# Patient Record
Sex: Male | Born: 1965 | Race: White | Hispanic: No | Marital: Married | Smoking: Former smoker
Health system: Southern US, Community
[De-identification: ages and names within clinical notes are randomized; demographics above are authoritative.]

## PROBLEM LIST (undated history)

## (undated) DIAGNOSIS — G459 Transient cerebral ischemic attack, unspecified: Secondary | ICD-10-CM

## (undated) DIAGNOSIS — I10 Essential (primary) hypertension: Secondary | ICD-10-CM

## (undated) DIAGNOSIS — G919 Hydrocephalus, unspecified: Secondary | ICD-10-CM

## (undated) DIAGNOSIS — J45909 Unspecified asthma, uncomplicated: Secondary | ICD-10-CM

## (undated) HISTORY — PX: NECK SURGERY: SHX720

## (undated) HISTORY — PX: BACK SURGERY: SHX140

## (undated) HISTORY — PX: APPENDECTOMY: SHX54

---

## 1985-12-23 DIAGNOSIS — G459 Transient cerebral ischemic attack, unspecified: Secondary | ICD-10-CM

## 1985-12-23 HISTORY — DX: Transient cerebral ischemic attack, unspecified: G45.9

## 2003-12-24 DIAGNOSIS — G919 Hydrocephalus, unspecified: Secondary | ICD-10-CM

## 2003-12-24 HISTORY — DX: Hydrocephalus, unspecified: G91.9

## 2007-12-29 ENCOUNTER — Emergency Department (HOSPITAL_COMMUNITY): Admission: EM | Admit: 2007-12-29 | Discharge: 2007-12-29 | Payer: Self-pay | Admitting: Emergency Medicine

## 2007-12-30 ENCOUNTER — Ambulatory Visit: Payer: Self-pay | Admitting: Gastroenterology

## 2008-01-01 ENCOUNTER — Ambulatory Visit (HOSPITAL_COMMUNITY): Admission: RE | Admit: 2008-01-01 | Discharge: 2008-01-01 | Payer: Self-pay | Admitting: Gastroenterology

## 2008-01-01 ENCOUNTER — Ambulatory Visit: Payer: Self-pay | Admitting: Gastroenterology

## 2009-07-13 ENCOUNTER — Encounter (INDEPENDENT_AMBULATORY_CARE_PROVIDER_SITE_OTHER): Payer: Self-pay | Admitting: General Surgery

## 2009-07-13 ENCOUNTER — Other Ambulatory Visit: Admission: RE | Admit: 2009-07-13 | Discharge: 2009-07-13 | Payer: Self-pay | Admitting: General Surgery

## 2010-09-28 ENCOUNTER — Emergency Department (HOSPITAL_COMMUNITY): Admission: EM | Admit: 2010-09-28 | Discharge: 2010-09-28 | Payer: Self-pay | Admitting: Emergency Medicine

## 2011-05-07 NOTE — Op Note (Signed)
Jose Harmon, Jose Harmon                 ACCOUNT NO.:  [id number]   MEDICAL RECORD NO.:  [id number]          PATIENT TYPE:  AMB   LOCATION:  DAY                           FACILITY:  [facility]   PHYSICIAN:  Kassie Mends, M.D.      DATE OF BIRTH:  02-Apr-1966   DATE OF PROCEDURE:  01/01/2008  DATE OF DISCHARGE:                               OPERATIVE REPORT   REFERRING PHYSICIAN:  Bradd Canary, MD.   PROCEDURE:  Colonoscopy.   INDICATION FOR EXAM:  Mr. Sleight is a 45 year old male with history of  multiple male cancers in the family.  He presents with rectal  bleeding.   FINDINGS:  1. Rare sigmoid diverticula.  Otherwise no polyps, masses,      inflammatory changes, or AVMs seen.  2. Normal retroflexed view of the rectum.   DIAGNOSIS:  No source for [name]'s rectal bleeding identified.  The  most likely etiology is an anal fissure,  considering he had pain on  rectal exam.   RECOMMENDATIONS:  1. Begin Anusol HC 1 per rectum every 12 hours for 14 days.  2. Screening colonoscopy in 10 years.  3. He should follow high-fiber diet.  He was given a handout on high-      fiber diet and diverticulosis.   MEDICATIONS:  1. Demerol 1 mg IV.  2. Versed 6 mg IV.   PROCEDURE TECHNIQUE:  Physical exam was performed.  Informed consent was  obtained from the patient explaining benefits, risks and alternatives to  the procedure.  The patient was connected to the monitor and placed in  the left lateral position.  Continuous oxygen was provided by nasal  cannula.  IV was medicine administered through an indwelling cannula.  After administration of sedation and rectal exam, the patient's rectum  was intubated.  The  scope was advanced under direct visualization to the cecum.  The scope  was removed slowly by carefully examining the color, texture, anatomy,  and integrity mucosa on the way out.  The patient was recovered in  endoscopy and was discharged home in satisfactory condition.      Kassie Mends, M.D.  Electronically Signed     SM/MEDQ  D:  01/01/2008  T:  01/01/2008  Job:  [id number]

## 2011-05-07 NOTE — Consult Note (Signed)
Jose Harmon, Jose Harmon                 ACCOUNT NO.:  [id number]   MEDICAL RECORD NO.:  [id number]          PATIENT TYPE:  AMB   LOCATION:  DAY                           FACILITY:  [facility]   PHYSICIAN:  Kassie Mends, M.D.      DATE OF BIRTH:  March 11, 1966   DATE OF CONSULTATION:  12/30/2007  DATE OF DISCHARGE:                                 CONSULTATION   REFERRING PHYSICIAN:  Bradd Canary, MD.   REASON FOR CONSULTATION:  Rectal bleeding.   HISTORY OF PRESENT ILLNESS:  Mr. Baca is a 45 year old male who  developed rectal bleeding on Sunday.  He has a significant family  history of three male relatives with GYN cancer.  His sister had  breast cancer at 90 and died. His other sister had ovarian cancer of 65  and died.  His mother had uterine cancer.  His father had throat cancer.  He went to have a bowel movement on Sunday and saw blood in his stool  and when he wiped.  He describes it as quite a bit.  He denies any  ripping pain in his rectum, rectal itching, abdominal pain, weight loss,  problem swallowing, heartburn, indigestion, nausea, vomiting or  diarrhea.  He did complain of a painful rectal exam.  He has had no  bowel movement since his visit on Sunday.  He occasionally has  constipation.  He had no pain in his rectum when the stool passed.   PAST MEDICAL HISTORY:  1. Back problems.  2. Hypertension.  3. History of a needle stick involving an HIV positive patient.   PAST SURGICAL HISTORY:  1. Appendectomy.  2. Brain surgery for hydrocephalous.  3. Herniated disk surgery.   ALLERGIES:  No known drug allergies.   MEDICATIONS:  1. Lisinopril 20 mg daily.  2. Lovastatin 20 mg daily.  3. Darvocet.   FAMILY HISTORY:  Per the HPI.  He has no family history of inflammatory  bowel disease.   SOCIAL HISTORY:  He is married and his wife is from [place]. He is  currently unemployed.  He denies any tobacco or alcohol use.   REVIEW OF SYSTEMS:  Per the HPI otherwise all  systems negative.   PHYSICAL EXAM:  Weight 187 pounds, height 5 feet 6 inches, BMI 30.2  (obese), temperature 98, blood pressure 118/88, pulse 64.  GENERAL:  He is in no apparent distress, alert and oriented x4.  HEENT:  Atraumatic, normocephalic.  Pupils equal and reactive to light.  Mouth no oral lesions.  Posterior pharynx without erythema or exudate.  NECK:  Full range of motion, no lymphadenopathy.  LUNGS:  Clear to auscultation bilaterally.  CARDIOVASCULAR:  Regular rhythm, no murmur, normal S1 and S2.  ABDOMEN:  Bowel sounds present, soft, nontender, nondistended.  No  rebound or guarding.  He has no abdominal bruits or pulsatile masses.  EXTREMITIES:  Without cyanosis, clubbing or edema.  NEURO:  He has no focal neurologic deficits.   LABS FROM JANUARY 6:  White count 8.6, hemoglobin 15, platelets 244.   ASSESSMENT:  Mr. Jacob is a  45 year old male who has a significant  family history of male cancers.  He presents with rectal bleeding.  The differential diagnosis includes anal fissure, hemorrhoids, and a low  likelihood of colon cancer due to hereditary non pulposus colon cancer.  Thank you for allowing me to see Mr. Schiefelbein in consultation.  My  recommendations follow.   RECOMMENDATIONS:  1. Mr. Dunson is new to town and his wife is visually impaired.  He      while have a colonoscopy on Friday.  2. Colonoscopy will be performed a HalfLytely bowel prep.  3. He has a follow-up appointment to see me as needed.  4. Scheduled with [facility] to manage his      hypertension.      Kassie Mends, M.D.  Electronically Signed     SM/MEDQ  D:  12/30/2007  T:  12/30/2007  Job:  [id number]

## 2011-09-12 LAB — DIFFERENTIAL
Basophils Absolute: 0
Basophils Relative: 0
Eosinophils Absolute: 0.1
Eosinophils Relative: 2
Lymphocytes Relative: 20
Lymphs Abs: 1.7
Monocytes Absolute: 0.5
Monocytes Relative: 6
Neutro Abs: 6.2
Neutrophils Relative %: 72

## 2011-09-12 LAB — CBC
HCT: 44.4
Hemoglobin: 15.1
MCHC: 33.9
MCV: 89.9
Platelets: 244
RBC: 4.94
RDW: 13.1
WBC: 8.6

## 2012-02-19 ENCOUNTER — Ambulatory Visit: Payer: Self-pay | Admitting: Unknown Physician Specialty

## 2012-03-28 ENCOUNTER — Emergency Department (HOSPITAL_COMMUNITY): Payer: Self-pay

## 2012-03-28 ENCOUNTER — Other Ambulatory Visit: Payer: Self-pay

## 2012-03-28 ENCOUNTER — Emergency Department (HOSPITAL_COMMUNITY)
Admission: EM | Admit: 2012-03-28 | Discharge: 2012-03-28 | Disposition: A | Discharge status: Home / Self Care | Payer: Self-pay | Attending: Emergency Medicine | Admitting: Emergency Medicine

## 2012-03-28 ENCOUNTER — Encounter (HOSPITAL_COMMUNITY): Payer: Self-pay | Admitting: Emergency Medicine

## 2012-03-28 DIAGNOSIS — M7710 Lateral epicondylitis, unspecified elbow: Secondary | ICD-10-CM | POA: Insufficient documentation

## 2012-03-28 DIAGNOSIS — Z136 Encounter for screening for cardiovascular disorders: Secondary | ICD-10-CM | POA: Insufficient documentation

## 2012-03-28 DIAGNOSIS — IMO0002 Reserved for concepts with insufficient information to code with codable children: Secondary | ICD-10-CM

## 2012-03-28 MED ORDER — IBUPROFEN 800 MG PO TABS: 800.0000 mg | ORAL_TABLET | Freq: Three times a day (TID) | ORAL | Status: AC

## 2012-03-28 MED ORDER — PREDNISONE 50 MG PO TABS: ORAL_TABLET | ORAL | Status: AC

## 2012-03-28 NOTE — ED Provider Notes (Signed)
History    Scribed for Glynn Octave, MD, the patient was seen in room [facility]. This chart was scribed by Katha Cabal.   CSN: [id number]  Arrival date & time 03/28/12  1903   First MD Initiated Contact with Patient 03/28/12 1911      Chief Complaint  Patient presents with  . Extremity Pain    (Consider location/radiation/quality/duration/timing/severity/associated sxs/prior Treatment)  Pt was seen at 7:18 PM   Patient is a 46 y.o. male presenting with extremity pain.  Extremity Pain This is a recurrent problem. Episode onset: 6 months ago. The problem occurs constantly. The problem has been gradually worsening (over past month). Pertinent negatives include no chest pain and no abdominal pain. The symptoms are aggravated by nothing. The symptoms are relieved by nothing. He has tried a cold compress for the symptoms.   Patient complains of left lateral epicondyle with numbness and tingling that radiates to left hand.  Denies weakness.  Patient is right handed.  There is no known injury.  Denies chest, abdominal and back pain.  No chronic medication problems.       History reviewed. No pertinent past medical history.  Past Surgical History  Procedure Date  . Appendectomy   . Back surgery   . Neck surgery     No family history on file.  History  Substance Use Topics  . Smoking status: Never Smoker   . Smokeless tobacco: Not on file  . Alcohol Use: No      Review of Systems  Cardiovascular: Negative for chest pain.  Gastrointestinal: Negative for abdominal pain.  Musculoskeletal: Negative for back pain.  Neurological: Positive for numbness. Negative for weakness.  All other systems reviewed and are negative.    Allergies  Review of patient's allergies indicates no known allergies.  Home Medications   Current Outpatient Rx  Name Route Sig Dispense Refill  . ASPIRIN EC 81 MG PO TBEC Oral Take 81 mg by mouth daily.    Marland Kitchen LISINOPRIL 20 MG PO TABS Oral Take  20 mg by mouth daily.    . IBUPROFEN 800 MG PO TABS Oral Take 1 tablet (800 mg total) by mouth 3 (three) times daily. 21 tablet 0  . PREDNISONE 50 MG PO TABS  1 tablet PO daily 5 tablet 0    BP 141/74  Pulse 85  Temp(Src) 97.5 F (36.4 C) (Oral)  Resp 20  Ht 5\' 6"  (1.676 m)  Wt 160 lb (72.576 kg)  BMI 25.82 kg/m2  SpO2 100%  Physical Exam  Nursing note and vitals reviewed. Constitutional: He is oriented to person, place, and time. He appears well-developed. No distress.  HENT:  Head: Normocephalic and atraumatic.  Eyes: Conjunctivae and EOM are normal.  Neck: Neck supple.  Cardiovascular: Normal rate and regular rhythm.   Pulmonary/Chest: Effort normal. No respiratory distress.  Abdominal: Soft. There is no tenderness. There is no rebound and no guarding.  Musculoskeletal: Normal range of motion. He exhibits no edema.       Cardial hand movements in tact, equal grip strength, FROM of elbow, wrist and finger, no evidence of septic joint   Neurological: He is alert and oriented to person, place, and time. No sensory deficit. Coordination normal.  Skin: Skin is warm and dry.  Psychiatric: He has a normal mood and affect. His behavior is normal.    ED Course  Procedures (including critical care time)    DIAGNOSTIC STUDIES: Oxygen Saturation is 100% on room air, normal by my  interpretation.     COORDINATION OF CARE: 7:23 PM  Physical exam complete.  8:12 PM  Plan to discharge patient.  Patient agrees with plan.     LABS / RADIOLOGY:   Labs Reviewed - No data to display Dg Elbow Complete Left  03/28/2012  *RADIOLOGY REPORT*  Clinical Data: Elbow pain.  No known injury  LEFT ELBOW - COMPLETE 3+ VIEW  Comparison:  None.  Findings:  There is no evidence of fracture, dislocation, or joint effusion.  There is no evidence of arthropathy or other focal bone abnormality.  Soft tissues are unremarkable.  IMPRESSION: Negative.  Original Report Authenticated By: Camelia Phenes, M.D.          MDM  Left lateral epicondyle pain and radiating to the forearm for the past several months. No known injury. No weakness. Cardinal hand movements intact, +2 radial pulse.  We'll treat as epicondylitis as followup with orthopedics.   Date: 03/28/2012  Rate: 80  Rhythm: normal sinus rhythm  QRS Axis: normal  Intervals: normal  ST/T Wave abnormalities: normal  Conduction Disutrbances:none  Narrative Interpretation:   Old EKG Reviewed: none available        MEDICATIONS GIVEN IN THE E.D. Scheduled Meds:   Continuous Infusions:       IMPRESSION: 1. Epicondylitis      NEW MEDICATIONS: New Prescriptions   IBUPROFEN (ADVIL,MOTRIN) 800 MG TABLET    Take 1 tablet (800 mg total) by mouth 3 (three) times daily.   PREDNISONE (DELTASONE) 50 MG TABLET    1 tablet PO daily      I personally performed the services described in this documentation, which was scribed in my presence.  The recorded information has been reviewed and considered.        Glynn Octave, MD 03/28/12 2306

## 2012-03-28 NOTE — ED Notes (Signed)
Pt with left elbow pain that radiates into the forearm x 6 months.  Denies injury

## 2012-03-28 NOTE — Discharge Instructions (Signed)
Lateral Epicondylitis (Tennis Elbow) with Rehab Lateral epicondylitis involves inflammation and pain around the outer portion of the elbow. The pain is caused by inflammation of the tendons in the forearm that bring back (extend) the wrist. Lateral epicondylittis is also called tennis elbow, because it is very common in tennis players. However, it may occur in any individual who extends the wrist repetitively. If lateral epicondylitis is left untreated, it may become a chronic problem. SYMPTOMS   Pain, tenderness, and inflammation on the outer (lateral) side of the elbow.   Pain or weakness with gripping activities.   Pain that increases with wrist twisting motions (playing tennis, using a screwdriver, opening a door or a jar).   Pain with lifting objects, including a coffee cup.  CAUSES  Lateral epicondylitis is caused by inflammation of the tendons that extend the wrist. Causes of injury may include:  Repetitive stress and strain on the muscles and tendons that extend the wrist.   Sudden change in activity level or intensity.   Incorrect grip in racquet sports.   Incorrect grip size of racquet (often too large).   Incorrect hitting position or technique (usually backhand, leading with the elbow).   Using a racket that is too heavy.  RISK INCREASES WITH:  Sports or occupations that require repetitive and/or strenuous forearm and wrist movements (tennis, squash, racquetball, carpentry).   Poor wrist and forearm strength and flexibility.   Failure to warm up properly before activity.   Resuming activity before healing, rehabilitation, and conditioning are complete.  PREVENTION   Warm up and stretch properly before activity.   Maintain physical fitness:   Strength, flexibility, and endurance.   Cardiovascular fitness.   Wear and use properly fitted equipment.   Learn and use proper technique and have a coach correct improper technique.   Wear a tennis elbow  (counterforce) brace.  PROGNOSIS  The course of this condition depends on the degree of the injury. If treated properly, acute cases (symptoms lasting less than 4 weeks) are often resolved in 2 to 6 weeks. Chronic (longer lasting cases) often resolve in 3 to 6 months, but may require physical therapy. RELATED COMPLICATIONS   Frequently recurring symptoms, resulting in a chronic problem. Properly treating the problem the first time decreases frequency of recurrence.   Chronic inflammation, scarring tendon degeneration, and partial tendon tear, requiring surgery.   Delayed healing or resolution of symptoms.  TREATMENT  Treatment first involves the use of ice and medicine, to reduce pain and inflammation. Strengthening and stretching exercises may help reduce discomfort, if performed regularly. These exercises may be performed at home, if the condition is an acute injury. Chronic cases may require a referral to a physical therapist for evaluation and treatment. Your caregiver may advise a corticosteroid injection, to help reduce inflammation. Rarely, surgery is needed. MEDICATION  If pain medicine is needed, nonsteroidal anti-inflammatory medicines (aspirin and ibuprofen), or other minor pain relievers (acetaminophen), are often advised.   Do not take pain medicine for 7 days before surgery.   Prescription pain relievers may be given, if your caregiver thinks they are needed. Use only as directed and only as much as you need.   Corticosteroid injections may be recommended. These injections should be reserved only for the most severe cases, because they can only be given a certain number of times.  HEAT AND COLD  Cold treatment (icing) should be applied for 10 to 15 minutes every 2 to 3 hours for inflammation and pain, and immediately  after activity that aggravates your symptoms. Use ice packs or an ice massage.   Heat treatment may be used before performing stretching and strengthening  activities prescribed by your caregiver, physical therapist, or athletic trainer. Use a heat pack or a warm water soak.  SEEK MEDICAL CARE IF: Symptoms get worse or do not improve in 2 weeks, despite treatment. EXERCISES  RANGE OF MOTION (ROM) AND STRETCHING EXERCISES - Epicondylitis, Lateral (Tennis Elbow) These exercises may help you when beginning to rehabilitate your injury. Your symptoms may go away with or without further involvement from your physician, physical therapist or athletic trainer. While completing these exercises, remember:   Restoring tissue flexibility helps normal motion to return to the joints. This allows healthier, less painful movement and activity.   An effective stretch should be held for at least 30 seconds.   A stretch should never be painful. You should only feel a gentle lengthening or release in the stretched tissue.  RANGE OF MOTION - Wrist Flexion, Active-Assisted  Extend your right / left elbow with your fingers pointing down.*   Gently pull the back of your hand towards you, until you feel a gentle stretch on the top of your forearm.   Hold this position for __________ seconds.  Repeat __________ times. Complete this exercise __________ times per day.  *If directed by your physician, physical therapist or athletic trainer, complete this stretch with your elbow bent, rather than extended. RANGE OF MOTION - Wrist Extension, Active-Assisted  Extend your right / left elbow and turn your palm upwards.*   Gently pull your palm and fingertips back, so your wrist extends and your fingers point more toward the ground.   You should feel a gentle stretch on the inside of your forearm.   Hold this position for __________ seconds.  Repeat __________ times. Complete this exercise __________ times per day. *If directed by your physician, physical therapist or athletic trainer, complete this stretch with your elbow bent, rather than extended. STRETCH - Wrist  Flexion  Place the back of your right / left hand on a tabletop, leaving your elbow slightly bent. Your fingers should point away from your body.   Gently press the back of your hand down onto the table by straightening your elbow. You should feel a stretch on the top of your forearm.   Hold this position for __________ seconds.  Repeat __________ times. Complete this stretch __________ times per day.  STRETCH - Wrist Extension   Place your right / left fingertips on a tabletop, leaving your elbow slightly bent. Your fingers should point backwards.   Gently press your fingers and palm down onto the table by straightening your elbow. You should feel a stretch on the inside of your forearm.   Hold this position for __________ seconds.  Repeat __________ times. Complete this stretch __________ times per day.  STRENGTHENING EXERCISES - Epicondylitis, Lateral (Tennis Elbow) These exercises may help you when beginning to rehabilitate your injury. They may resolve your symptoms with or without further involvement from your physician, physical therapist or athletic trainer. While completing these exercises, remember:   Muscles can gain both the endurance and the strength needed for everyday activities through controlled exercises.   Complete these exercises as instructed by your physician, physical therapist or athletic trainer. Increase the resistance and repetitions only as guided.   You may experience muscle soreness or fatigue, but the pain or discomfort you are trying to eliminate should never worsen during these exercises. If  this pain does get worse, stop and make sure you are following the directions exactly. If the pain is still present after adjustments, discontinue the exercise until you can discuss the trouble with your caregiver.  STRENGTH - Wrist Flexors  Sit with your right / left forearm palm-up and fully supported on a table or countertop. Your elbow should be resting below the  height of your shoulder. Allow your wrist to extend over the edge of the surface.   Loosely holding a __________ weight, or a piece of rubber exercise band or tubing, slowly curl your hand up toward your forearm.   Hold this position for __________ seconds. Slowly lower the wrist back to the starting position in a controlled manner.  Repeat __________ times. Complete this exercise __________ times per day.  STRENGTH - Wrist Extensors  Sit with your right / left forearm palm-down and fully supported on a table or countertop. Your elbow should be resting below the height of your shoulder. Allow your wrist to extend over the edge of the surface.   Loosely holding a __________ weight, or a piece of rubber exercise band or tubing, slowly curl your hand up toward your forearm.   Hold this position for __________ seconds. Slowly lower the wrist back to the starting position in a controlled manner.  Repeat __________ times. Complete this exercise __________ times per day.  STRENGTH - Ulnar Deviators  Stand with a ____________________ weight in your right / left hand, or sit while holding a rubber exercise band or tubing, with your healthy arm supported on a table or countertop.   Move your wrist, so that your pinkie travels toward your forearm and your thumb moves away from your forearm.   Hold this position for __________ seconds and then slowly lower the wrist back to the starting position.  Repeat __________ times. Complete this exercise __________ times per day STRENGTH - Radial Deviators  Stand with a ____________________ weight in your right / left hand, or sit while holding a rubber exercise band or tubing, with your injured arm supported on a table or countertop.   Raise your hand upward in front of you or pull up on the rubber tubing.   Hold this position for __________ seconds and then slowly lower the wrist back to the starting position.  Repeat __________ times. Complete this  exercise __________ times per day. STRENGTH - Forearm Supinators   Sit with your right / left forearm supported on a table, keeping your elbow below shoulder height. Rest your hand over the edge, palm down.   Gently grip a hammer or a soup ladle.   Without moving your elbow, slowly turn your palm and hand upward to a "thumbs-up" position.   Hold this position for __________ seconds. Slowly return to the starting position.  Repeat __________ times. Complete this exercise __________ times per day.  STRENGTH - Forearm Pronators   Sit with your right / left forearm supported on a table, keeping your elbow below shoulder height. Rest your hand over the edge, palm up.   Gently grip a hammer or a soup ladle.   Without moving your elbow, slowly turn your palm and hand upward to a "thumbs-up" position.   Hold this position for __________ seconds. Slowly return to the starting position.  Repeat __________ times. Complete this exercise __________ times per day.  STRENGTH - Grip  Grasp a tennis ball, a dense sponge, or a large, rolled sock in your hand.   Squeeze as hard  as you can, without increasing any pain.   Hold this position for __________ seconds. Release your grip slowly.  Repeat __________ times. Complete this exercise __________ times per day.  STRENGTH - Elbow Extensors, Isometric  Stand or sit upright, on a firm surface. Place your right / left arm so that your palm faces your stomach, and it is at the height of your waist.   Place your opposite hand on the underside of your forearm. Gently push up as your right / left arm resists. Push as hard as you can with both arms, without causing any pain or movement at your right / left elbow. Hold this stationary position for __________ seconds.  Gradually release the tension in both arms. Allow your muscles to relax completely before repeating. Document Released: 12/09/2005 Document Revised: 11/28/2011 Document Reviewed:  03/23/2009 [facility] Patient Information 2012 [place], [place].

## 2013-01-04 ENCOUNTER — Emergency Department (HOSPITAL_COMMUNITY): Payer: 59

## 2013-01-04 ENCOUNTER — Emergency Department (HOSPITAL_COMMUNITY)
Admission: EM | Admit: 2013-01-04 | Discharge: 2013-01-04 | Disposition: A | Discharge status: Home / Self Care | Payer: 59 | Attending: Emergency Medicine | Admitting: Emergency Medicine

## 2013-01-04 ENCOUNTER — Encounter (HOSPITAL_COMMUNITY): Payer: Self-pay | Admitting: *Deleted

## 2013-01-04 DIAGNOSIS — Z7982 Long term (current) use of aspirin: Secondary | ICD-10-CM | POA: Insufficient documentation

## 2013-01-04 DIAGNOSIS — Z79899 Other long term (current) drug therapy: Secondary | ICD-10-CM | POA: Insufficient documentation

## 2013-01-04 DIAGNOSIS — B349 Viral infection, unspecified: Secondary | ICD-10-CM

## 2013-01-04 DIAGNOSIS — R5381 Other malaise: Secondary | ICD-10-CM | POA: Insufficient documentation

## 2013-01-04 DIAGNOSIS — B9789 Other viral agents as the cause of diseases classified elsewhere: Secondary | ICD-10-CM | POA: Insufficient documentation

## 2013-01-04 DIAGNOSIS — I10 Essential (primary) hypertension: Secondary | ICD-10-CM | POA: Insufficient documentation

## 2013-01-04 DIAGNOSIS — R630 Anorexia: Secondary | ICD-10-CM | POA: Insufficient documentation

## 2013-01-04 DIAGNOSIS — IMO0001 Reserved for inherently not codable concepts without codable children: Secondary | ICD-10-CM | POA: Insufficient documentation

## 2013-01-04 DIAGNOSIS — R61 Generalized hyperhidrosis: Secondary | ICD-10-CM | POA: Insufficient documentation

## 2013-01-04 DIAGNOSIS — J3489 Other specified disorders of nose and nasal sinuses: Secondary | ICD-10-CM | POA: Insufficient documentation

## 2013-01-04 HISTORY — DX: Essential (primary) hypertension: I10

## 2013-01-04 MED ORDER — ALBUTEROL SULFATE HFA 108 (90 BASE) MCG/ACT IN AERS
2.0000 | INHALATION_SPRAY | Freq: Once | RESPIRATORY_TRACT | Status: AC
  Administered 2013-01-04: 2 via RESPIRATORY_TRACT
  Filled 2013-01-04: qty 6.7

## 2013-01-04 MED ORDER — HYDROCOD POLST-CHLORPHEN POLST 10-8 MG/5ML PO LQCR: 5.0000 mL | Freq: Two times a day (BID) | ORAL | Status: AC

## 2013-01-04 MED ORDER — OSELTAMIVIR PHOSPHATE 75 MG PO CAPS: 75.0000 mg | ORAL_CAPSULE | Freq: Two times a day (BID) | ORAL | Status: AC

## 2013-01-04 NOTE — ED Notes (Signed)
J. Idol, PA at bedside. 

## 2013-01-04 NOTE — ED Notes (Signed)
Pt with flu like symptoms, c/o pain with coughing, denies N/V/D

## 2013-01-04 NOTE — ED Notes (Signed)
Patient transported to X-ray 

## 2013-01-04 NOTE — ED Provider Notes (Signed)
History     CSN: [id number]  Arrival date & time 01/04/13  1049   First MD Initiated Contact with Patient 01/04/13 1127      Chief Complaint  Patient presents with  . Influenza    (Consider location/radiation/quality/duration/timing/severity/associated sxs/prior treatment) Patient is a 47 y.o. male presenting with flu symptoms. The history is provided by the patient.  Influenza This is a new problem. Episode onset: 2 days ago. The problem has been gradually worsening. Associated symptoms include anorexia, congestion, coughing, diaphoresis, fatigue, a fever, headaches and myalgias. Pertinent negatives include no abdominal pain, arthralgias, change in bowel habit, chest pain, joint swelling, nausea, neck pain, numbness, sore throat, vomiting or weakness. Associated symptoms comments: Cough has been nonproductive.  Fever has been to 102. He has been tolerating PO fluids. . Nothing aggravates the symptoms. He has tried acetaminophen for the symptoms. The treatment provided no relief.    Past Medical History  Diagnosis Date  . Hypertension     Past Surgical History  Procedure Date  . Appendectomy   . Back surgery   . Neck surgery     No family history on file.  History  Substance Use Topics  . Smoking status: Never Smoker   . Smokeless tobacco: Not on file  . Alcohol Use: No      Review of Systems  Constitutional: Positive for fever, diaphoresis and fatigue.  HENT: Positive for congestion. Negative for sore throat and neck pain.   Eyes: Negative.   Respiratory: Positive for cough. Negative for chest tightness and shortness of breath.   Cardiovascular: Negative for chest pain.  Gastrointestinal: Positive for anorexia. Negative for nausea, vomiting, abdominal pain and change in bowel habit.  Genitourinary: Negative.  Negative for difficulty urinating.  Musculoskeletal: Positive for myalgias. Negative for joint swelling and arthralgias.  Skin: Negative.   Neurological:  Positive for headaches. Negative for dizziness, weakness, light-headedness and numbness.  Hematological: Negative.   Psychiatric/Behavioral: Negative.     Allergies  Review of patient's allergies indicates no known allergies.  Home Medications   Current Outpatient Rx  Name  Route  Sig  Dispense  Refill  . ACETAMINOPHEN 500 MG PO TABS   Oral   Take 1,000 mg by mouth every 6 (six) hours as needed. Pain/fever.         . ASPIRIN EC 81 MG PO TBEC   Oral   Take 81 mg by mouth daily.         Marland Kitchen LISINOPRIL 20 MG PO TABS   Oral   Take 20 mg by mouth daily.         . MELOXICAM 15 MG PO TABS   Oral   Take 15 mg by mouth daily.         Marland Kitchen HYDROCOD POLST-CPM POLST ER 10-8 MG/5ML PO LQCR   Oral   Take 5 mLs by mouth every 12 (twelve) hours.   100 mL   0   . OSELTAMIVIR PHOSPHATE 75 MG PO CAPS   Oral   Take 1 capsule (75 mg total) by mouth every 12 (twelve) hours.   10 capsule   0     BP 127/86  Pulse 86  Temp 98.1 F (36.7 C) (Oral)  Resp 16  Ht 5\' 6"  (1.676 m)  Wt 168 lb (76.204 kg)  BMI 27.12 kg/m2  SpO2 96%  Physical Exam  Nursing note and vitals reviewed. Constitutional: He appears well-developed and well-nourished.  HENT:  Head: Normocephalic and atraumatic.  Right  Ear: Tympanic membrane normal.  Left Ear: Tympanic membrane normal.  Nose: No mucosal edema or rhinorrhea.  Mouth/Throat: Uvula is midline and mucous membranes are normal. No oropharyngeal exudate, posterior oropharyngeal edema, posterior oropharyngeal erythema or tonsillar abscesses.  Eyes: Conjunctivae normal are normal.  Neck: Normal range of motion. Neck supple.  Cardiovascular: Normal rate, regular rhythm, normal heart sounds and intact distal pulses.   Pulmonary/Chest: Effort normal. No respiratory distress. He has wheezes. He has no rhonchi. He has no rales.       Expiratory wheeze right base.  Abdominal: Soft. Bowel sounds are normal. There is no tenderness.  Musculoskeletal: Normal  range of motion.  Neurological: He is alert.  Skin: Skin is warm and dry.  Psychiatric: He has a normal mood and affect.    ED Course  Procedures (including critical care time)  Labs Reviewed - No data to display Dg Chest 2 View  01/04/2013  *RADIOLOGY REPORT*  Clinical Data: Body aches, fever, cough, congestion, hypertension  CHEST - 2 VIEW  Comparison: None.  Findings: Upper normal heart size. Mediastinal contours and pulmonary vascularity. Lungs clear. No pleural effusion or pneumothorax. No acute osseous findings.  IMPRESSION: No acute abnormalities.   Original Report Authenticated By: Ulyses Southward, M.D.      1. Viral syndrome       MDM  Probable influenza.  Pt's wife also here to be seen for similar sx (hers started 2 days before his).  Patients labs and/or radiological studies were reviewed during the medical decision making and disposition process. Pt was prescribed tamiflu,  Also prescribed tussionex for cough,  Albuterol mdi with spacer given, instructed in use by respiratory therapist.  Encouraged rest, increased fluids.  Tylenol or motrin for fever reduction.  Recheck here for any worsened sx.  The patient appears reasonably screened and/or stabilized for discharge and I doubt any other medical condition or other [facility] requiring further screening, evaluation, or treatment in the ED at this time prior to discharge.         Burgess Amor, [place] 01/04/13 1555

## 2013-01-04 NOTE — ED Notes (Signed)
Body aches, headache, fever, non productive cough. Denies N/V/D. Wife is being treated for same.

## 2013-01-04 NOTE — ED Provider Notes (Signed)
Medical screening examination/treatment/procedure(s) were performed by non-physician practitioner and as supervising physician I was immediately available for consultation/collaboration.   Jorge Retz W Chuckie Mccathern, MD 01/04/13 1627 

## 2013-02-06 ENCOUNTER — Other Ambulatory Visit: Payer: Self-pay

## 2013-04-11 ENCOUNTER — Emergency Department: Payer: Self-pay | Admitting: Emergency Medicine

## 2013-04-11 LAB — PRO B NATRIURETIC PEPTIDE: B-Type Natriuretic Peptide: 33 pg/mL (ref 0–125)

## 2013-04-11 LAB — COMPREHENSIVE METABOLIC PANEL
Albumin: 3.9 g/dL (ref 3.4–5.0)
Calcium, Total: 8.4 mg/dL — ABNORMAL LOW (ref 8.5–10.1)
Chloride: 109 mmol/L — ABNORMAL HIGH (ref 98–107)
EGFR (African American): >60
EGFR (Non-African Amer.): >60
Glucose: 84 mg/dL (ref 65–99)
Potassium: 3.8 mmol/L (ref 3.5–5.1)
SGOT(AST): 16 U/L (ref 15–37)
Sodium: 142 mmol/L (ref 136–145)
Total Protein: 6.7 g/dL (ref 6.4–8.2)

## 2013-04-11 LAB — CBC
HCT: 40.3 % (ref 40.0–52.0)
HGB: 13.8 g/dL (ref 13.0–18.0)
Platelet: 205 10*3/uL (ref 150–440)
RBC: 4.45 10*6/uL (ref 4.40–5.90)
RDW: 13.1 % (ref 11.5–14.5)
WBC: 8.5 10*3/uL (ref 3.8–10.6)

## 2013-04-11 LAB — CK TOTAL AND CKMB (NOT AT ARMC): CK-MB: 0.8 ng/mL (ref 0.5–3.6)

## 2013-04-11 LAB — TROPONIN I: Troponin-I: <0.02 ng/mL

## 2013-08-12 ENCOUNTER — Ambulatory Visit: Payer: Self-pay | Admitting: Specialist

## 2013-10-28 ENCOUNTER — Other Ambulatory Visit: Payer: Self-pay

## 2014-01-08 ENCOUNTER — Emergency Department: Payer: Self-pay | Admitting: Emergency Medicine

## 2016-08-19 ENCOUNTER — Encounter (HOSPITAL_COMMUNITY): Payer: Self-pay

## 2016-08-19 ENCOUNTER — Emergency Department (HOSPITAL_COMMUNITY)
Admission: EM | Admit: 2016-08-19 | Discharge: 2016-08-19 | Disposition: A | Discharge status: Home / Self Care | Payer: BLUE CROSS/BLUE SHIELD | Attending: Emergency Medicine | Admitting: Emergency Medicine

## 2016-08-19 ENCOUNTER — Emergency Department (HOSPITAL_COMMUNITY): Payer: BLUE CROSS/BLUE SHIELD

## 2016-08-19 DIAGNOSIS — Z5181 Encounter for therapeutic drug level monitoring: Secondary | ICD-10-CM | POA: Diagnosis not present

## 2016-08-19 DIAGNOSIS — R531 Weakness: Secondary | ICD-10-CM | POA: Insufficient documentation

## 2016-08-19 DIAGNOSIS — Z8673 Personal history of transient ischemic attack (TIA), and cerebral infarction without residual deficits: Secondary | ICD-10-CM | POA: Diagnosis not present

## 2016-08-19 DIAGNOSIS — Z7982 Long term (current) use of aspirin: Secondary | ICD-10-CM | POA: Diagnosis not present

## 2016-08-19 DIAGNOSIS — I10 Essential (primary) hypertension: Secondary | ICD-10-CM | POA: Diagnosis not present

## 2016-08-19 DIAGNOSIS — Z87891 Personal history of nicotine dependence: Secondary | ICD-10-CM | POA: Insufficient documentation

## 2016-08-19 DIAGNOSIS — R519 Headache, unspecified: Secondary | ICD-10-CM

## 2016-08-19 DIAGNOSIS — Z79899 Other long term (current) drug therapy: Secondary | ICD-10-CM | POA: Diagnosis not present

## 2016-08-19 DIAGNOSIS — J45909 Unspecified asthma, uncomplicated: Secondary | ICD-10-CM | POA: Insufficient documentation

## 2016-08-19 DIAGNOSIS — R51 Headache: Secondary | ICD-10-CM | POA: Insufficient documentation

## 2016-08-19 HISTORY — DX: Transient cerebral ischemic attack, unspecified: G45.9

## 2016-08-19 HISTORY — DX: Hydrocephalus, unspecified: G91.9

## 2016-08-19 HISTORY — DX: Unspecified asthma, uncomplicated: J45.909

## 2016-08-19 LAB — URINALYSIS, ROUTINE W REFLEX MICROSCOPIC
Bilirubin Urine: NEGATIVE
GLUCOSE, UA: NEGATIVE mg/dL
HGB URINE DIPSTICK: NEGATIVE
KETONES UR: NEGATIVE mg/dL
LEUKOCYTES UA: NEGATIVE
Nitrite: NEGATIVE
PROTEIN: NEGATIVE mg/dL
Specific Gravity, Urine: 1.017 (ref 1.005–1.030)
pH: 7 (ref 5.0–8.0)

## 2016-08-19 LAB — I-STAT CHEM 8, ED
BUN: 16 mg/dL (ref 6–20)
CALCIUM ION: 1.18 mmol/L (ref 1.13–1.30)
CREATININE: 1.1 mg/dL (ref 0.61–1.24)
Chloride: 105 mmol/L (ref 101–111)
GLUCOSE: 90 mg/dL (ref 65–99)
HCT: 44 % (ref 39.0–52.0)
HEMOGLOBIN: 15 g/dL (ref 13.0–17.0)
POTASSIUM: 3.5 mmol/L (ref 3.5–5.1)
Sodium: 145 mmol/L (ref 135–145)
TCO2: 25 mmol/L (ref 0–100)

## 2016-08-19 LAB — DIFFERENTIAL
BASOS ABS: 0 10*3/uL (ref 0.0–0.1)
Basophils Relative: 0 %
Eosinophils Absolute: 0.1 10*3/uL (ref 0.0–0.7)
Eosinophils Relative: 1 %
LYMPHS PCT: 21 %
Lymphs Abs: 1.7 10*3/uL (ref 0.7–4.0)
MONOS PCT: 6 %
Monocytes Absolute: 0.5 10*3/uL (ref 0.1–1.0)
NEUTROS ABS: 5.9 10*3/uL (ref 1.7–7.7)
Neutrophils Relative %: 72 %

## 2016-08-19 LAB — COMPREHENSIVE METABOLIC PANEL
ALBUMIN: 4.2 g/dL (ref 3.5–5.0)
ALT: 22 U/L (ref 17–63)
AST: 21 U/L (ref 15–41)
Alkaline Phosphatase: 44 U/L (ref 38–126)
Anion gap: 9 (ref 5–15)
BILIRUBIN TOTAL: 0.3 mg/dL (ref 0.3–1.2)
BUN: 13 mg/dL (ref 6–20)
CO2: 25 mmol/L (ref 22–32)
CREATININE: 1.14 mg/dL (ref 0.61–1.24)
Calcium: 9.2 mg/dL (ref 8.9–10.3)
Chloride: 108 mmol/L (ref 101–111)
GFR calc Af Amer: >60 mL/min (ref >60)
GLUCOSE: 93 mg/dL (ref 65–99)
POTASSIUM: 3.5 mmol/L (ref 3.5–5.1)
Sodium: 142 mmol/L (ref 135–145)
TOTAL PROTEIN: 6.6 g/dL (ref 6.5–8.1)

## 2016-08-19 LAB — CBC
HEMATOCRIT: 45.2 % (ref 39.0–52.0)
HEMOGLOBIN: 14.8 g/dL (ref 13.0–17.0)
MCH: 30.8 pg (ref 26.0–34.0)
MCHC: 32.7 g/dL (ref 30.0–36.0)
MCV: 94 fL (ref 78.0–100.0)
Platelets: 239 10*3/uL (ref 150–400)
RBC: 4.81 MIL/uL (ref 4.22–5.81)
RDW: 12.7 % (ref 11.5–15.5)
WBC: 8.1 10*3/uL (ref 4.0–10.5)

## 2016-08-19 LAB — RAPID URINE DRUG SCREEN, HOSP PERFORMED
Amphetamines: NOT DETECTED
BARBITURATES: NOT DETECTED
BENZODIAZEPINES: NOT DETECTED
COCAINE: NOT DETECTED
Opiates: NOT DETECTED
TETRAHYDROCANNABINOL: NOT DETECTED

## 2016-08-19 LAB — CBG MONITORING, ED: GLUCOSE-CAPILLARY: 94 mg/dL (ref 65–99)

## 2016-08-19 LAB — I-STAT TROPONIN, ED: TROPONIN I, POC: 0 ng/mL (ref 0.00–0.08)

## 2016-08-19 LAB — APTT: APTT: 26 s (ref 24–36)

## 2016-08-19 LAB — PROTIME-INR
INR: 0.94
Prothrombin Time: 12.6 seconds (ref 11.4–15.2)

## 2016-08-19 LAB — ETHANOL

## 2016-08-19 MED ORDER — METHYLPREDNISOLONE SODIUM SUCC 125 MG IJ SOLR
125.0000 mg | Freq: Once | INTRAMUSCULAR | Status: AC
  Administered 2016-08-19: 125 mg via INTRAVENOUS
  Filled 2016-08-19: qty 2

## 2016-08-19 MED ORDER — SODIUM CHLORIDE 0.9 % IV BOLUS (SEPSIS)
1000.0000 mL | Freq: Once | INTRAVENOUS | Status: AC
  Administered 2016-08-19: 1000 mL via INTRAVENOUS

## 2016-08-19 MED ORDER — METOCLOPRAMIDE HCL 5 MG/ML IJ SOLN
10.0000 mg | Freq: Once | INTRAMUSCULAR | Status: AC
  Administered 2016-08-19: 10 mg via INTRAVENOUS
  Filled 2016-08-19: qty 2

## 2016-08-19 MED ORDER — DIPHENHYDRAMINE HCL 50 MG/ML IJ SOLN
25.0000 mg | Freq: Once | INTRAMUSCULAR | Status: AC
  Administered 2016-08-19: 25 mg via INTRAVENOUS
  Filled 2016-08-19: qty 1

## 2016-08-19 NOTE — ED Provider Notes (Signed)
[facility]PT Provider Note   CSN: [id number] Arrival date & time: 08/19/16  1640     History   Chief Complaint Chief Complaint  Patient presents with  . Weakness  . Headache    HPI Jose Harmon is a 50 y.o. male.  With left arm and leg weakness and paresthesias. Similar to previous TIA. No     Migraine  This is a recurrent problem. The current episode started 6 to 12 hours ago. The problem occurs constantly. The problem has not changed since onset.Pertinent negatives include no chest pain. Nothing aggravates the symptoms. Nothing relieves the symptoms. He has tried nothing for the symptoms.    Past Medical History:  Diagnosis Date  . Asthma   . Hydrocephalus 2005  . Hypertension   . TIA (transient ischemic attack) 1987    There are no active problems to display for this patient.   Past Surgical History:  Procedure Laterality Date  . APPENDECTOMY    . BACK SURGERY    . NECK SURGERY         Home Medications    Prior to Admission medications   Medication Sig Start Date End Date Taking? Authorizing Provider  acetaminophen (TYLENOL) 500 MG tablet Take 1,000 mg by mouth every 6 (six) hours as needed. Pain/fever.    Historical Provider, MD  aspirin EC 81 MG tablet Take 81 mg by mouth daily.    Historical Provider, MD  chlorpheniramine-HYDROcodone (TUSSIONEX PENNKINETIC ER) 10-8 MG/5ML LQCR Take 5 mLs by mouth every 12 (twelve) hours. 01/04/13   Burgess Amor, PA-C  lisinopril (PRINIVIL,ZESTRIL) 20 MG tablet Take 20 mg by mouth daily.    Historical Provider, MD  meloxicam (MOBIC) 15 MG tablet Take 15 mg by mouth daily.    Historical Provider, MD  oseltamivir (TAMIFLU) 75 MG capsule Take 1 capsule (75 mg total) by mouth every 12 (twelve) hours. 01/04/13   Burgess Amor, PA-C    Family History No family history on file.  Social History Social History  Substance Use Topics  . Smoking status: Former Games developer  . Smokeless tobacco: Former Neurosurgeon  . Alcohol use No      Allergies   Review of patient's allergies indicates no known allergies.   Review of Systems Review of Systems  Cardiovascular: Negative for chest pain.  All other systems reviewed and are negative.    Physical Exam Updated Vital Signs BP 108/71   Pulse (!) 56   Temp 98 F (36.7 C)   Resp 21 Comment: Simultaneous filing. User may not have seen previous data.  Ht 5\' 6"  (1.676 m)   Wt 160 lb (72.6 kg)   SpO2 97%   BMI 25.82 kg/m   Physical Exam  Constitutional: He appears well-developed and well-nourished.  HENT:  Head: Normocephalic and atraumatic.  Eyes: Conjunctivae are normal.  Neck: Neck supple.  Cardiovascular: Normal rate and regular rhythm.   No murmur heard. Pulmonary/Chest: Effort normal and breath sounds normal. No respiratory distress.  Abdominal: Soft. There is no tenderness.  Musculoskeletal: He exhibits no edema.  Neurological: He is alert.  Slightly decreased grip strength on left  Skin: Skin is warm and dry.  Psychiatric: He has a normal mood and affect.  Nursing note and vitals reviewed.    ED Treatments / Results  Labs (all labs ordered are listed, but only abnormal results are displayed) Labs Reviewed  ETHANOL  PROTIME-INR  APTT  CBC  DIFFERENTIAL  COMPREHENSIVE METABOLIC PANEL  URINE RAPID DRUG SCREEN,  HOSP PERFORMED  URINALYSIS, ROUTINE W REFLEX MICROSCOPIC (NOT AT [facility])  CBG MONITORING, ED  I-STAT CHEM 8, ED  I-STAT TROPOININ, ED    EKG  EKG Interpretation  Date/Time:  Monday August 19 2016 16:43:33 EDT Ventricular Rate:  77 PR Interval:    QRS Duration: 107 QT Interval:  386 QTC Calculation: 437 R Axis:   48 Text Interpretation:  Sinus arrhythmia Abnormal R-wave progression, early transition No significant change since last tracing Confirmed by [facility] MD, Barbara Cower [phone]) on 08/19/2016 4:47:43 PM       Radiology Ct Head Wo Contrast  Result Date: 08/19/2016 CLINICAL DATA:  Headache since 3 o'clock this morning.  Remote history of hydrocephalus and surgery. EXAM: CT HEAD WITHOUT CONTRAST TECHNIQUE: Contiguous axial images were obtained from the base of the skull through the vertex without intravenous contrast. COMPARISON:  None. FINDINGS: Brain: The ventricles are slightly prominent for age but are normal in configuration. No extra-axial fluid collections are identified. The gray-white differentiation is normal. No acute intracranial findings such as hemorrhage or infarction. No mass lesions. Probable dilated perivascular space in the right lower basal ganglia region. The brainstem and cerebellum appear normal. Vascular: No vascular calcifications or definite aneurysm. Skull: No skull fracture or bone lesion. There are remote surgical changes involving the right frontal area. Sinuses/Orbits: The paranasal sinuses and mastoid air cells are clear. The globes are intact. Other: Non IMPRESSION: No acute intracranial findings. Electronically Signed   By: Rudie Meyer M.D.   On: 08/19/2016 18:45   Mr Brain Wo Contrast  Result Date: 08/19/2016 CLINICAL DATA:  50 y/o M; superior headache and left arm numbness since 3 a.m. History of hydrocephalus with catheter placement and removal in 2005. EXAM: MRI HEAD WITHOUT CONTRAST TECHNIQUE: Multiplanar, multiecho pulse sequences of the brain and surrounding structures were obtained without intravenous contrast. COMPARISON:  08/19/2016 CT head. FINDINGS: Brain: No diffusion restriction to suggest acute infarct. No abnormal susceptibility hypointensity to indicate intracranial hemorrhage. 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 No focal mass effect. Mild parenchymal volume loss. Nonspecific slight asymmetric prominence of left Meckel's cave Extra-axial space: T2 hyperintense tract traverses the right frontal lobe left-sided pleural pole 2 frontal horn of right lateral ventricle compatible with prior catheter. Lateral and third  ventricles are mildly enlarged. No extra-axial collection is identified. Proximal intracranial flow voids are maintained. Other: No abnormal signal of the paranasal sinuses. No abnormal signal of the mastoid air cells. Orbits are unremarkable. Susceptibility artifact from right frontal burr hole, otherwise unremarkable calvarium. IMPRESSION: 1. No acute intracranial abnormality is identified. 2. Mild ventriculomegaly borderline for degree of atrophy. 3. 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 Electronically Signed   By: Mitzi Hansen M.D.   On: 08/19/2016 21:44    Procedures Procedures (including critical care time)  Medications Ordered in ED Medications  metoCLOPramide (REGLAN) injection 10 mg (10 mg Intravenous Given 08/19/16 2011)  diphenhydrAMINE (BENADRYL) injection 25 mg (25 mg Intravenous Given 08/19/16 2012)  methylPREDNISolone sodium succinate (SOLU-MEDROL) 125 mg/2 mL injection 125 mg (125 mg Intravenous Given 08/19/16 2011)  sodium chloride 0.9 % bolus 1,000 mL (0 mLs Intravenous Stopped 08/19/16 2151)     Initial Impression / Assessment and Plan / ED Course  I have reviewed the triage vital signs and the nursing notes.  Pertinent labs & imaging results that were available during my care of the patient were reviewed by me and  considered in my medical decision making (see chart for details).  Clinical Course   50 yo M w/ likely complicated migraine. Symptoms improved with headache improvement. MRI negative. Ambulates normally. Unlikely stroke. Plan for discharge with neurology follow up. Return here for new/worsenig symptoms.   Final Clinical Impressions(s) / ED Diagnoses   Final diagnoses:  Weakness  Nonintractable headache, unspecified chronicity pattern, unspecified headache type    New Prescriptions Discharge Medication List as of 08/19/2016 10:25 PM       Marily MemosJason Zakhia Seres, MD 08/20/16 [phone]

## 2016-08-19 NOTE — ED Notes (Signed)
Pt wife at bedside.

## 2016-08-19 NOTE — ED Notes (Signed)
Dr. Mesner at bedside   

## 2016-08-19 NOTE — ED Notes (Signed)
CBG 94 

## 2016-08-19 NOTE — ED Triage Notes (Signed)
Pt arrives EMS with c/o headache and left arm weakness since 3 am. Pt has hx of previous burr hole

## 2017-07-15 IMAGING — CT CT HEAD W/O CM
3 of 4 series · 17 of 47 positions shown, 20 images · non-contrast
Comparison: None.

CLINICAL DATA: Headache since 3 o'clock this morning. Remote
history of hydrocephalus and surgery.

EXAM:
CT HEAD WITHOUT CONTRAST
TECHNIQUE: Contiguous axial images were obtained from the base of the skull
through the vertex without intravenous contrast.

[Series 201: head w/o, idose (1) · axial · non-contrast · 0.39mm/px · z∈[+30,+160]mm · 11 of 32 slices shown, 14 images]
[im 3/32  brain]
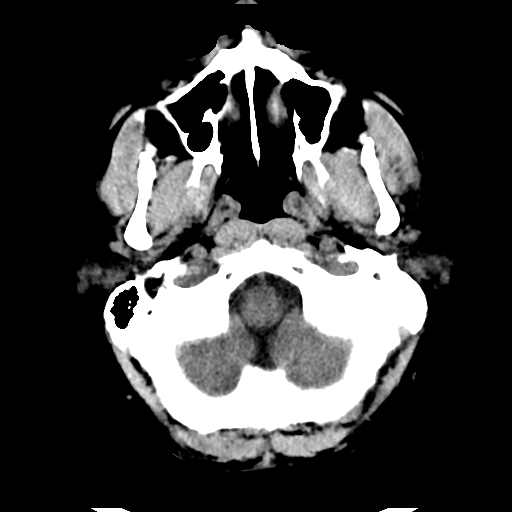
[im 3/32  bone]
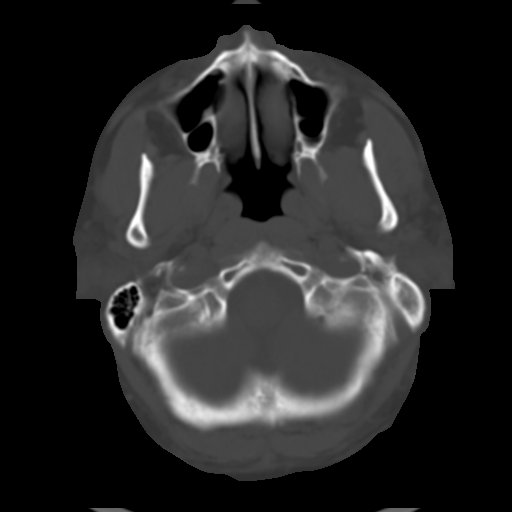
[im 5/32  brain]
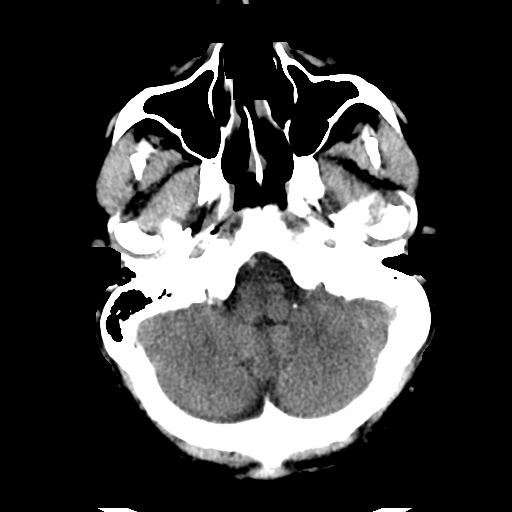
[im 7/32  brain]
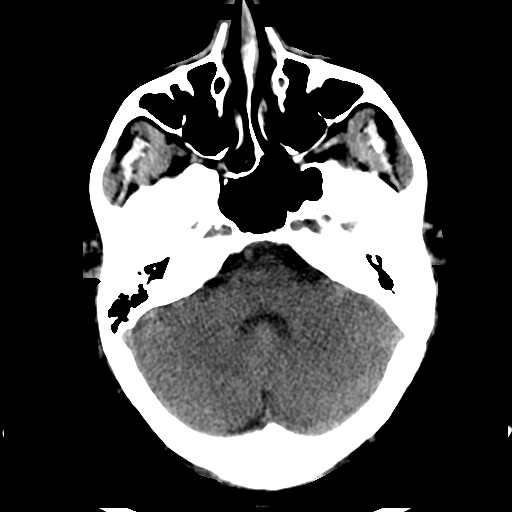
[im 12/32  brain]
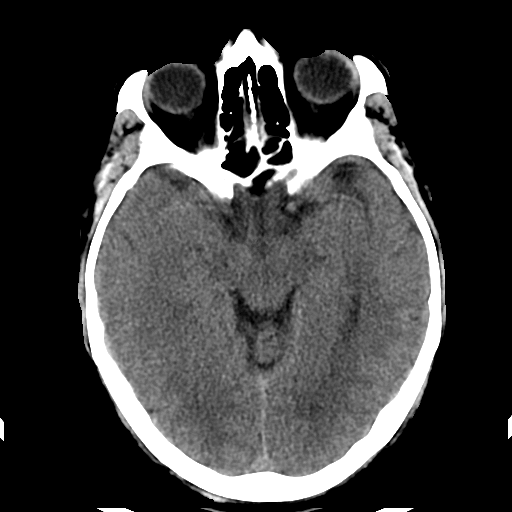
[im 14/32  brain]
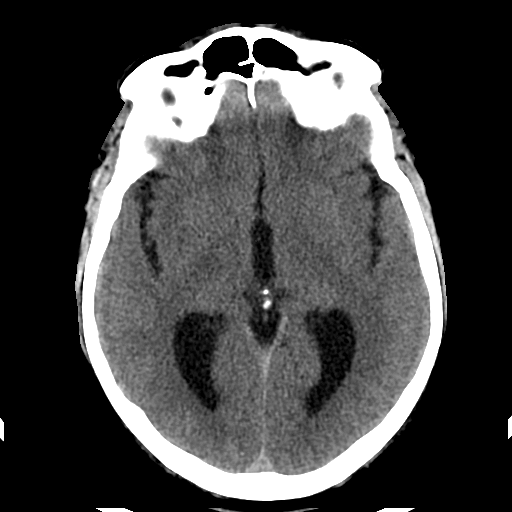
[im 14/32  bone]
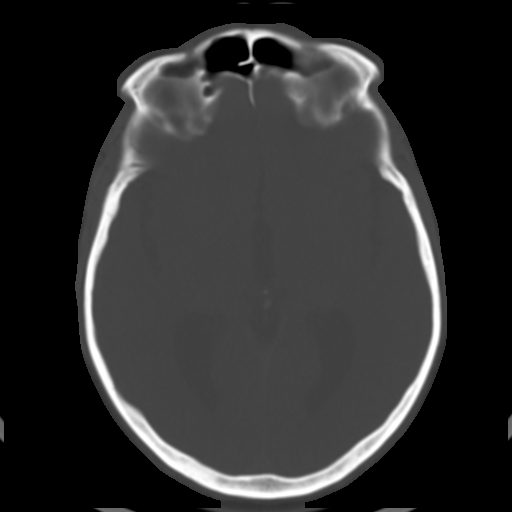
[im 16/32  brain]
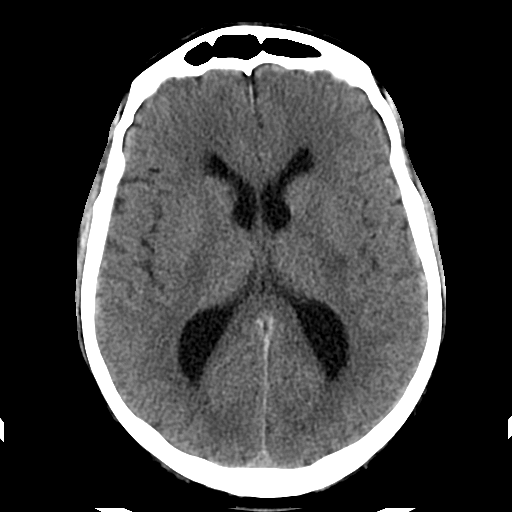
[im 18/32  brain]
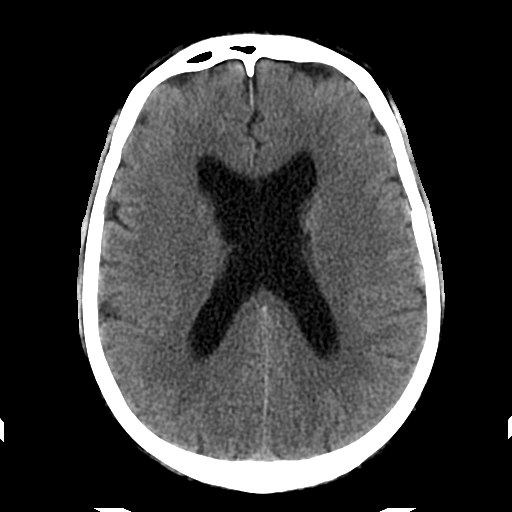
[im 20/32  brain]
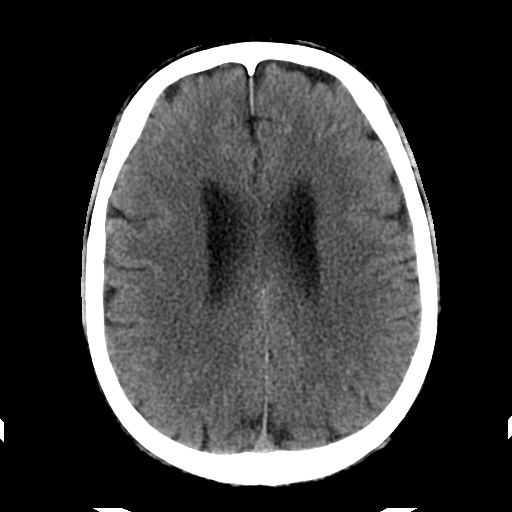
[im 25/32  brain]
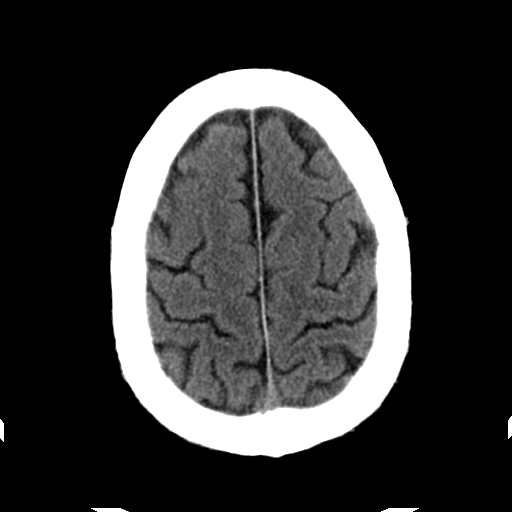
[im 25/32  bone]
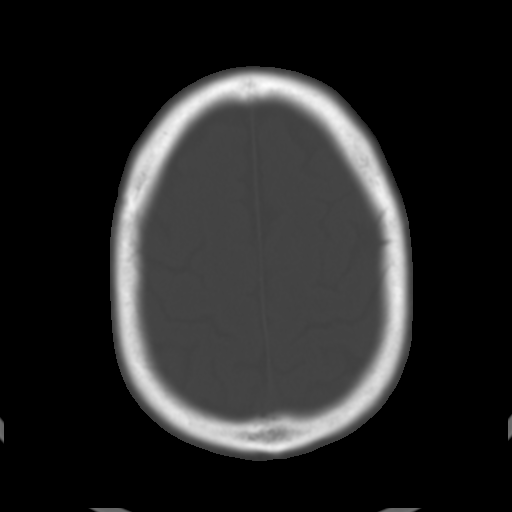
[im 27/32  brain]
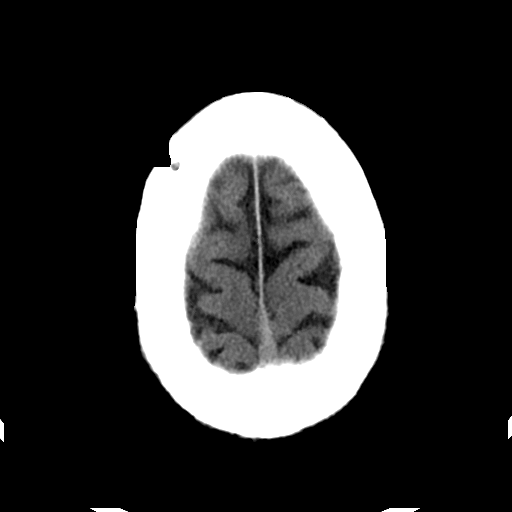
[im 29/32  brain]
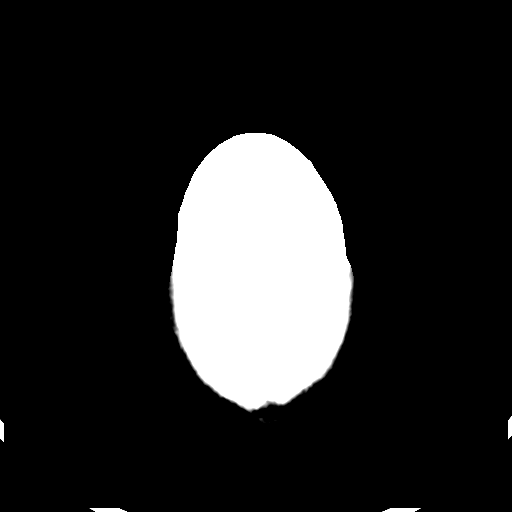

[Series 203: coronal st, idose (1) · coronal · 0.39mm/px · 3 of 63 slices shown]
[im 21/63  brain]
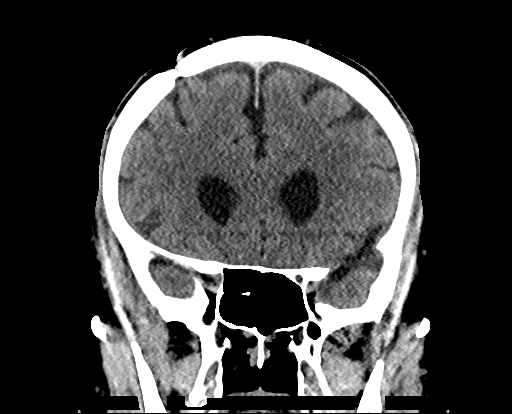
[im 28/63  brain]
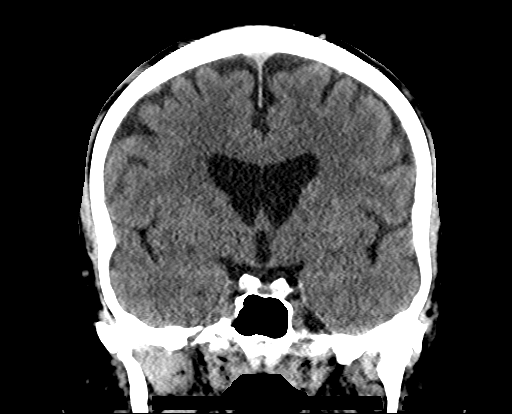
[im 35/63  brain]
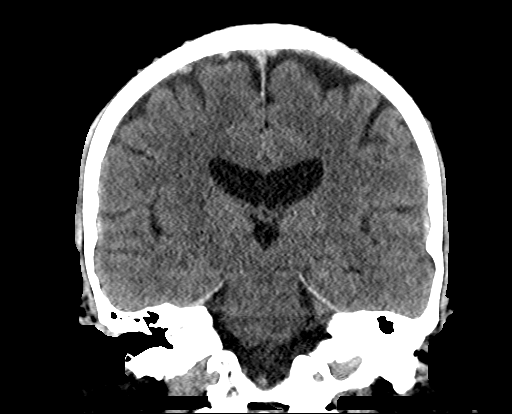

[Series 204: sagittal st, idose (1) · sagittal · 0.39mm/px · 3 of 63 slices shown]
[im 21/63  brain]
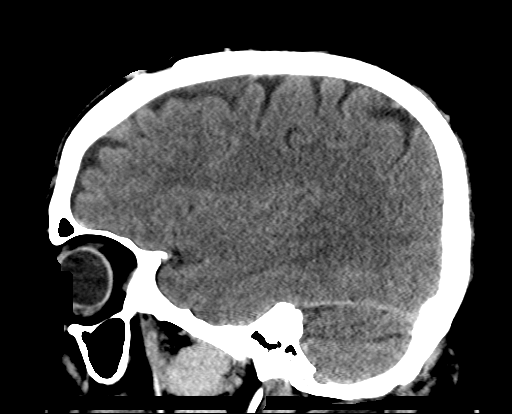
[im 32/63  brain]
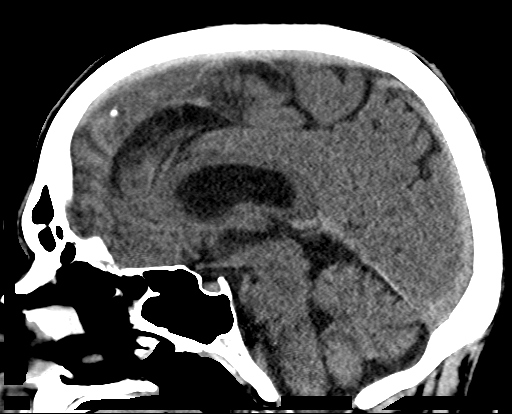
[im 42/63  brain]
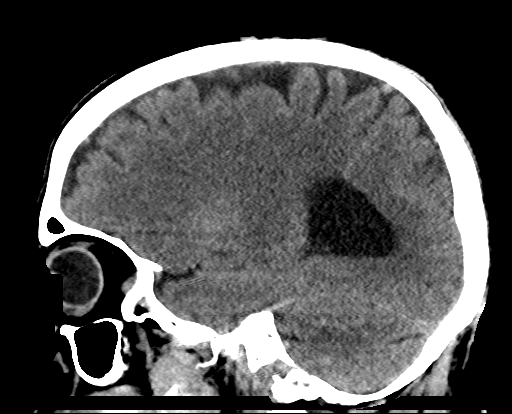

[17 of 47 positions shown; findings below may reference images not displayed]

FINDINGS: Brain: The ventricles are slightly prominent for age but are normal
in configuration. No extra-axial fluid collections are identified.
The gray-white differentiation is normal. No acute intracranial
findings such as hemorrhage or infarction. No mass lesions. Probable
dilated perivascular space in the right lower basal ganglia region.
The brainstem and cerebellum appear normal.

Vascular: No vascular calcifications or definite aneurysm.

Skull: No skull fracture or bone lesion. There are remote surgical
changes involving the right frontal area.

Sinuses/Orbits: The paranasal sinuses and mastoid air cells are
clear. The globes are intact.

Other: Non
IMPRESSION: No acute intracranial findings.

## 2017-11-25 ENCOUNTER — Encounter: Payer: Self-pay | Admitting: Gastroenterology

## 2020-04-03 ENCOUNTER — Other Ambulatory Visit: Payer: Self-pay

## 2020-04-03 ENCOUNTER — Ambulatory Visit
Admission: EM | Admit: 2020-04-03 | Discharge: 2020-04-03 | Disposition: A | Discharge status: Home / Self Care | Payer: BLUE CROSS/BLUE SHIELD | Attending: Family Medicine | Admitting: Family Medicine

## 2020-04-03 DIAGNOSIS — I10 Essential (primary) hypertension: Secondary | ICD-10-CM

## 2020-04-03 MED ORDER — LISINOPRIL 20 MG PO TABS: 20.0000 mg | ORAL_TABLET | Freq: Every day | ORAL | 1 refills | Status: AC

## 2020-04-03 NOTE — ED Provider Notes (Signed)
[facility]    CSN: [id number] Arrival date & time: 04/03/20  1714      History   Chief Complaint Chief Complaint  Patient presents with  . Medication Refill    HPI Jose Harmon is a 54 y.o. male.   HPI  Patient presents for a medication refill of his blood pressure medication. He has been without blood pressure medication for at least 6 months. He is without health insurance and has had difficulty getting in to his previous providers office. He had checked his blood pressure daily and up until 2 weeks ago his readings have been less than 140/80. He has a BP log present with him today confirming readings had been stable. Denies chest pain, shortness of breath, or swelling. Endorses an occasional headache. Previously prescribed lisinopril 20 mg and endorse good BP control with medication.  Past Medical History:  Diagnosis Date  . Asthma   . Hydrocephalus 2005  . Hypertension   . TIA (transient ischemic attack) 1987    There are no problems to display for this patient.   Past Surgical History:  Procedure Laterality Date  . APPENDECTOMY    . BACK SURGERY    . NECK SURGERY         Home Medications    Prior to Admission medications   Medication Sig Start Date End Date Taking? Authorizing Provider  acetaminophen (TYLENOL) 500 MG tablet Take 1,000 mg by mouth every 6 (six) hours as needed. Pain/fever.    [provider]  aspirin EC 81 MG tablet Take 81 mg by mouth daily.    [provider]  chlorpheniramine-HYDROcodone (TUSSIONEX PENNKINETIC ER) 10-8 MG/5ML LQCR Take 5 mLs by mouth every 12 (twelve) hours. 01/04/13   Evalee Jefferson, PA-C  lisinopril (ZESTRIL) 20 MG tablet Take 1 tablet (20 mg total) by mouth daily. 04/03/20   Scot Jun, FNP  meloxicam (MOBIC) 15 MG tablet Take 15 mg by mouth daily.    [provider]  oseltamivir (TAMIFLU) 75 MG capsule Take 1 capsule (75 mg total) by mouth every 12 (twelve) hours. 01/04/13    Evalee Jefferson, PA-C    Family History No family history on file.  Social History Social History   Tobacco Use  . Smoking status: Former Research scientist (life sciences)  . Smokeless tobacco: Former Network engineer Use Topics  . Alcohol use: No  . Drug use: No     Allergies   Patient has no known allergies.   Review of Systems Review of Systems Pertinent negatives listed in HPI  Physical Exam Triage Vital Signs ED Triage Vitals  Enc Vitals Group     BP 04/03/20 1729 (!) 172/101     Pulse --      Resp 04/03/20 1729 18     Temp 04/03/20 1729 98.1 F (36.7 C)     Temp src --      SpO2 04/03/20 1729 98 %     Weight --      Height --      Head Circumference --      Peak Flow --      Pain Score 04/03/20 1727 0     Pain Loc --      Pain Edu? --      Excl. in [place]? --    No data found.  Updated Vital Signs BP (!) 172/101   Temp 98.1 F (36.7 C)   Resp 18   SpO2 98%   Visual Acuity Right Eye  Distance:   Left Eye Distance:   Bilateral Distance:    Right Eye Near:   Left Eye Near:    Bilateral Near:     Physical Exam General appearance: alert, well developed, well nourished, cooperative and in no distress Head: Normocephalic, without obvious abnormality, atraumatic Respiratory: Respirations even and unlabored, normal respiratory rate Heart: rate and rhythm normal. No gallop or murmurs noted on exam  Skin: Skin color, texture, turgor normal. No rashes seen  Psych: Appropriate mood and affect. Neurologic: A&O x 3, 5/5 strength, normal gait, normal coordination. UC Treatments / Results  Labs (all labs ordered are listed, but only abnormal results are displayed) Labs Reviewed - No data to display  EKG   Radiology No results found.  Procedures Procedures (including critical care time)  Medications Ordered in UC Medications - No data to display  Initial Impression / Assessment and Plan / UC Course  I have reviewed the triage vital signs and the nursing notes.  Pertinent  labs & imaging results that were available during my care of the patient were reviewed by me and considered in my medical decision making (see chart for details).     Essential hypertension -Resume lisinopril 20 mg daily -Continue to monitor blood pressure -If readings are consistently greater than 140/80, return here to UC. -Red flags discussed. -Schedule an appt with [facility] to establish with PCP    Final Clinical Impressions(s) / UC Diagnoses   Final diagnoses:  Essential hypertension   Discharge Instructions   None    ED Prescriptions    Medication Sig Dispense Auth. Provider   lisinopril (ZESTRIL) 20 MG tablet Take 1 tablet (20 mg total) by mouth daily. 90 tablet Bing Neighbors, FNP     PDMP not reviewed this encounter.   Bing Neighbors, FNP 04/05/20 [phone]

## 2020-04-03 NOTE — ED Triage Notes (Signed)
Pt here for refill on for lisinopril, pt has been out for 6 months , has been unable to get in with PCP for past year
